# Patient Record
Sex: Female | Born: 1955
Health system: Southern US, Community
[De-identification: ages and names within clinical notes are randomized; demographics above are authoritative.]

## PROBLEM LIST (undated history)

## (undated) DIAGNOSIS — E039 Hypothyroidism, unspecified: Secondary | ICD-10-CM

## (undated) DIAGNOSIS — Z8249 Family history of ischemic heart disease and other diseases of the circulatory system: Secondary | ICD-10-CM

## (undated) DIAGNOSIS — R079 Chest pain, unspecified: Secondary | ICD-10-CM

## (undated) DIAGNOSIS — E785 Hyperlipidemia, unspecified: Secondary | ICD-10-CM

## (undated) HISTORY — DX: Hypothyroidism, unspecified: E03.9

## (undated) HISTORY — DX: Family history of ischemic heart disease and other diseases of the circulatory system: Z82.49

## (undated) HISTORY — DX: Chest pain, unspecified: R07.9

## (undated) HISTORY — DX: Hyperlipidemia, unspecified: E78.5

---

## 1999-11-28 ENCOUNTER — Other Ambulatory Visit: Admission: RE | Admit: 1999-11-28 | Discharge: 1999-11-28 | Payer: Self-pay | Admitting: Obstetrics and Gynecology

## 2000-11-28 ENCOUNTER — Other Ambulatory Visit: Admission: RE | Admit: 2000-11-28 | Discharge: 2000-11-28 | Payer: Self-pay | Admitting: Obstetrics and Gynecology

## 2002-04-08 ENCOUNTER — Other Ambulatory Visit: Admission: RE | Admit: 2002-04-08 | Discharge: 2002-04-08 | Payer: Self-pay | Admitting: Obstetrics and Gynecology

## 2003-04-12 ENCOUNTER — Other Ambulatory Visit: Admission: RE | Admit: 2003-04-12 | Discharge: 2003-04-12 | Payer: Self-pay | Admitting: Obstetrics and Gynecology

## 2004-05-08 ENCOUNTER — Other Ambulatory Visit: Admission: RE | Admit: 2004-05-08 | Discharge: 2004-05-08 | Payer: Self-pay | Admitting: Obstetrics and Gynecology

## 2005-08-03 ENCOUNTER — Other Ambulatory Visit: Admission: RE | Admit: 2005-08-03 | Discharge: 2005-08-03 | Payer: Self-pay | Admitting: Obstetrics and Gynecology

## 2006-08-11 ENCOUNTER — Inpatient Hospital Stay (HOSPITAL_COMMUNITY): Admission: EM | Admit: 2006-08-11 | Discharge: 2006-08-12 | Payer: Self-pay | Admitting: Emergency Medicine

## 2006-09-13 ENCOUNTER — Ambulatory Visit: Payer: Self-pay | Admitting: Vascular Surgery

## 2010-06-12 LAB — CBC WITH DIFFERENTIAL
Basophil %: 0.4
Lymphocytes Absolute: 2 /uL
MCH: 29.3
MCHC: 33.4
MCV: 87.8
Monocyte %: 6.1
Neutro Abs: 2.52
Platelet: 203
RDW-CV: 15.2
WBC: 4.77

## 2010-06-12 LAB — MICROALBUMIN / CREATININE URINE RATIO: Microalb, Ur: 1.9

## 2010-12-12 LAB — CBC WITH DIFFERENTIAL
BASO#: 0 /uL
Eosinophil #: 0.1
LYMPH#: 1.6
MPV: 9 fL (ref 7.5–11.5)
NEUT#: 2
Platelet: 215
RBC: 4.48
RDW: 15
WBC: 4

## 2010-12-12 LAB — COMPREHENSIVE METABOLIC PANEL
AST (SGOT): 22
Bilirubin Total: 0.3
Calcium: 9.2
Creat: 1
Glucose: 83
Sodium: 139
Total Protein: 7.9

## 2010-12-12 LAB — LIPID PANEL
Chol/HDL Ratio: 3.3
Cholesterol: 199
HDL: 60
VLDL Cholesterol Cal: 18

## 2011-01-03 LAB — T4 AND TSH: T4, Total: 12.8

## 2011-01-09 LAB — THYROTROPIN BINDING INHIB IG: Thyrotropin Binding Inhib Ig: 23.6

## 2012-01-16 LAB — CBC WITH DIFFERENTIAL
Basophils Absolute: 0
Eosinophil #: 0.1
HCT: 41.8
HGB: 13.6
LYMPH%: 37 %
MONO%: 7 %
MPV: 10.4 fL (ref 7.5–11.5)
NEUT#: 2.4
NEUT%: 54 %
RBC: 4.51
RDW: 15.6
WBC: 4.4

## 2012-01-16 LAB — COMPREHENSIVE METABOLIC PANEL
AST: 13
Albumin: 3.7
BUN: 22
Calcium: 8.9
Carbon Dioxide, Total: 26
Chloride: 104
Glucose: 83
Potassium: 4.1

## 2012-01-16 LAB — LIPID PANEL
Chol/HDL Ratio: 3
Cholesterol: 172 (ref 0–200)
HDL: 58
LDL (calc): 106
Triglycerides: 41

## 2012-01-16 LAB — TSH+FREE T4: Free T4: 0.8

## 2012-01-16 LAB — PROTIME-INR: Protime: 10.5

## 2012-05-16 ENCOUNTER — Encounter: Payer: Self-pay | Admitting: Gastroenterology

## 2012-06-03 LAB — TSH+FREE T4
Free T4: 1.08
TSH: 0.61

## 2012-06-03 LAB — CBC WITH DIFFERENTIAL
BASO%: 1 %
EOS%: 1 %
HCT: 41.9
LYMPH#: 1.5
MCH: 30.6
MCHC: 33.4
MCV: 91.5
MONO%: 7 %
NEUT#: 1.9
Platelets: 200
RBC: 4.58
RDW: 15.9
WBC: 3.7

## 2012-06-03 LAB — URINALYSIS
Bilirubin: NEGATIVE
Glucose: NORMAL
Ketones: NEGATIVE
Protein: NEGATIVE
Urobilinogen, UA: NORMAL

## 2012-06-03 LAB — COMPREHENSIVE METABOLIC PANEL
Albumin: 3.6
BUN/Creatinine Ratio: 22
Glucose: 77
Potassium: 3.9
Total Protein: 7.9

## 2012-06-03 LAB — LYME IGG/IGM AB: Lyme IgG/IgM Ab: NEGATIVE

## 2012-06-03 LAB — LIPID PANEL
HDL: 74
Triglycerides: 27

## 2012-06-19 ENCOUNTER — Encounter: Payer: Self-pay | Admitting: Gastroenterology

## 2012-06-19 ENCOUNTER — Ambulatory Visit (AMBULATORY_SURGERY_CENTER): Payer: BC Managed Care – PPO | Admitting: *Deleted

## 2012-06-19 VITALS — Ht 62.0 in | Wt 136.8 lb

## 2012-06-19 DIAGNOSIS — Z1211 Encounter for screening for malignant neoplasm of colon: Secondary | ICD-10-CM

## 2012-06-19 MED ORDER — MOVIPREP 100 G PO SOLR: ORAL | Status: DC

## 2012-06-30 ENCOUNTER — Ambulatory Visit (AMBULATORY_SURGERY_CENTER): Payer: BC Managed Care – PPO | Admitting: Gastroenterology

## 2012-06-30 ENCOUNTER — Encounter: Payer: Self-pay | Admitting: Gastroenterology

## 2012-06-30 VITALS — BP 95/55 | HR 69 | Temp 98.5°F | Resp 11 | Ht 62.0 in | Wt 136.0 lb

## 2012-06-30 DIAGNOSIS — D126 Benign neoplasm of colon, unspecified: Secondary | ICD-10-CM

## 2012-06-30 DIAGNOSIS — Z1211 Encounter for screening for malignant neoplasm of colon: Secondary | ICD-10-CM

## 2012-06-30 HISTORY — PX: COLONOSCOPY: SHX174

## 2012-06-30 MED ORDER — SODIUM CHLORIDE 0.9 % IV SOLN: 500.0000 mL | INTRAVENOUS | Status: DC

## 2012-06-30 NOTE — Op Note (Signed)
[facility] [address] [place], [address]   COLONOSCOPY PROCEDURE REPORT  PATIENT: Bonnie Larson, Bonnie Larson  MR#: [id number] BIRTHDATE: 12-24-1955 , 56  yrs. old GENDER: Female ENDOSCOPIST: Rachael Fee, MD REFERRED NW:[id number] Vincente Poli, M.D. PROCEDURE DATE:  06/30/2012 PROCEDURE:   Colonoscopy with snare polypectomy ASA CLASS:   Class III INDICATIONS:average risk screening. MEDICATIONS: Fentanyl 75 mcg IV, Versed 7 mg IV, and These medications were titrated to patient response per physician's verbal order  DESCRIPTION OF PROCEDURE:   After the risks benefits and alternatives of the procedure were thoroughly explained, informed consent was obtained.  A digital rectal exam revealed no abnormalities of the rectum.   The LB CF-[id number]  endoscope was introduced through the anus and advanced to the cecum, which was identified by both the appendix and ileocecal valve. No adverse events experienced.   The quality of the prep was good, using MoviPrep  The instrument was then slowly withdrawn as the colon was fully examined.  COLON FINDINGS: Two small polyps were found, removed and both sent to pathology.  These were both sessile, 2-42mm across, located in ascending and sigmoid segments, removed with cold snare (jar 1). The examination was otherwise normal.  Retroflexed views revealed no abnormalities. The time to cecum=2 minutes 57 seconds. Withdrawal time=9 minutes 09 seconds.  The scope was withdrawn and the procedure completed. COMPLICATIONS: There were no complications.  ENDOSCOPIC IMPRESSION: Two small polyps were found, removed and both were sent to pathology. The examination was otherwise normal.  RECOMMENDATIONS: If the polyp(s) removed today are proven to be adenomatous (pre-cancerous) polyps, you will need a repeat colonoscopy in 5 years.  Otherwise you should continue to follow colorectal cancer screening guidelines for "routine risk" patients  with colonoscopy in 10 years.  You will receive a letter within 1-2 weeks with the results of your biopsy as well as final recommendations.  Please call my office if you have not received a letter after 3 weeks.   eSigned:  Rachael Fee, MD 06/30/2012 1:42 PM

## 2012-06-30 NOTE — Progress Notes (Signed)
Patient did not experience any of the following events: a burn prior to discharge; a fall within the facility; wrong site/side/patient/procedure/implant event; or a hospital transfer or hospital admission upon discharge from the facility. (G8907) Patient did not have preoperative order for IV antibiotic SSI prophylaxis. (G8918)  

## 2012-06-30 NOTE — Patient Instructions (Signed)
YOU HAD AN ENDOSCOPIC PROCEDURE TODAY AT THE [facility]: Refer to the procedure report that was given to you for any specific questions about what was found during the examination.  If the procedure report does not answer your questions, please call your gastroenterologist to clarify.  If you requested that your care partner not be given the details of your procedure findings, then the procedure report has been included in a sealed envelope for you to review at your convenience later.  YOU SHOULD EXPECT: Some feelings of bloating in the abdomen. Passage of more gas than usual.  Walking can help get rid of the air that was put into your GI tract during the procedure and reduce the bloating. If you had a lower endoscopy (such as a colonoscopy or flexible sigmoidoscopy) you may notice spotting of blood in your stool or on the toilet paper. If you underwent a bowel prep for your procedure, then you may not have a normal bowel movement for a few days.  DIET: Your first meal following the procedure should be a light meal and then it is ok to progress to your normal diet.  A half-sandwich or bowl of soup is an example of a good first meal.  Heavy or fried foods are harder to digest and may make you feel nauseous or bloated.  Likewise meals heavy in dairy and vegetables can cause extra gas to form and this can also increase the bloating.  Drink plenty of fluids but you should avoid alcoholic beverages for 24 hours.  ACTIVITY: Your care partner should take you home directly after the procedure.  You should plan to take it easy, moving slowly for the rest of the day.  You can resume normal activity the day after the procedure however you should NOT DRIVE or use heavy machinery for 24 hours (because of the sedation medicines used during the test).    SYMPTOMS TO REPORT IMMEDIATELY: A gastroenterologist can be reached at any hour.  During normal business hours, 8:30 AM to 5:00 PM Monday through Friday,  call [phone].  After hours and on weekends, please call the GI answering service at [phone] who will take a message and have the physician on call contact you.   Following lower endoscopy (colonoscopy or flexible sigmoidoscopy):  Excessive amounts of blood in the stool  Significant tenderness or worsening of abdominal pains  Swelling of the abdomen that is new, acute  Fever of 100F or higher    FOLLOW UP: If any biopsies were taken you will be contacted by phone or by letter within the next 1-3 weeks.  Call your gastroenterologist if you have not heard about the biopsies in 3 weeks.  Our staff will call the home number listed on your records the next business day following your procedure to check on you and address any questions or concerns that you may have at that time regarding the information given to you following your procedure. This is a courtesy call and so if there is no answer at the home number and we have not heard from you through the emergency physician on call, we will assume that you have returned to your regular daily activities without incident.  SIGNATURES/CONFIDENTIALITY: You and/or your care partner have signed paperwork which will be entered into your electronic medical record.  These signatures attest to the fact that that the information above on your After Visit Summary has been reviewed and is understood.  Full responsibility of the confidentiality   of this discharge information lies with you and/or your care-partner.     

## 2012-07-01 ENCOUNTER — Telehealth: Payer: Self-pay | Admitting: *Deleted

## 2012-07-01 NOTE — Telephone Encounter (Signed)
  Follow up Call-  Call back number 06/30/2012  Post procedure Call Back phone  # [phone]  Permission to leave phone message Yes     [facility]

## 2012-07-10 ENCOUNTER — Encounter: Payer: Self-pay | Admitting: Gastroenterology

## 2012-12-04 ENCOUNTER — Other Ambulatory Visit: Payer: Self-pay | Admitting: Obstetrics and Gynecology

## 2013-11-21 LAB — COMPREHENSIVE METABOLIC PANEL
ALT: 21
ANION GAP: 12.5
AST (SGOT): 14
Albumin: 3.8
Alkaline Phosphatase: 38
BILIRUBIN TOTAL: 0.4
BUN / CREAT RATIO: 20.9
BUN: 23
CO2: 26
CREATININE: 1.1
Calcium: 9
Chloride: 104
Glucose: 85
Potassium: 3.9
SODIUM: 139
Total Protein: 8 g/dL

## 2013-11-21 LAB — URINALYSIS
BILIRUBIN (URINE): NEGATIVE
BLOOD UA: NEGATIVE
GLUCOSE: NORMAL
KETONES: NEGATIVE
Leukocytes, UA: NEGATIVE — AB
NITRITE: POSITIVE
PROTEIN UR: NEGATIVE
Specific Gravity, Urine: 1.025
URINE PH: 5
UROBILINOGEN UA: NORMAL

## 2013-11-21 LAB — CBC WITH DIFFERENTIAL
BASO%: 1 %
Basophils Absolute: 0
EOS ABS: 0.1
EOS%: 1 %
HCT: 42.8
HEMOGLOBIN: 14.2
LYMPH#: 1.7
LYMPH%: 42 %
MCH: 30.2
MCHC: 33.2
MCV: 91
MONO#: 0.3
MONO%: 7 %
MPV: 9.1 fL (ref 7.5–11.5)
NEUT#: 2
NEUTROS PCT: 48.8
PLATELETS: 197
RBC: 4.71
RDW: 15.1
WBC: 4.1

## 2013-11-21 LAB — TSH+FREE T4
Free T4: 1.03
TSH: 0.34

## 2013-11-21 LAB — LIPID PANEL
CHOLESTEROL: 198
Chol/HDL Ratio, serum: 2.5
HDL: 78
LDL (calc): 111
TRIGLYCERIDES: 46
VLDL CHOLESTEROL CAL: 9

## 2013-12-29 ENCOUNTER — Other Ambulatory Visit: Payer: Self-pay | Admitting: Family Medicine

## 2013-12-29 DIAGNOSIS — Z1231 Encounter for screening mammogram for malignant neoplasm of breast: Secondary | ICD-10-CM

## 2014-01-21 ENCOUNTER — Ambulatory Visit
Admission: RE | Admit: 2014-01-21 | Discharge: 2014-01-21 | Disposition: A | Discharge status: Home / Self Care | Payer: BC Managed Care – PPO | Source: Ambulatory Visit | Attending: Family Medicine | Admitting: Family Medicine

## 2014-01-21 DIAGNOSIS — Z1231 Encounter for screening mammogram for malignant neoplasm of breast: Secondary | ICD-10-CM

## 2014-09-22 ENCOUNTER — Other Ambulatory Visit: Payer: Self-pay

## 2014-09-22 LAB — TSH+FREE T4
T4,FREE (DIRECT): 1.35
TSH: 0.521

## 2014-09-22 LAB — CBC WITH DIFFERENTIAL
Basophils Absolute: 1
EOS: 1 %
HEMATOCRIT: 43
HEMOGLOBIN: 14.1
LYMPHS PCT: 40
Lymphocytes Absolute: 2 /uL
MCH: 29.4
MCHC: 33.2
MCV: 89
MONOS PCT: 7
Monocytes Absolute: 0
Neutro Abs: 2.5
Neutrophils: 51
Platelets: 207
RBC: 4.79
RDW: 15.2
WBC: 4.8

## 2014-09-22 LAB — LIPID PANEL
Cholesterol, Total: 221
HDL Cholesterol: 81
LDL CHOLESTEROL (CALC): 126
LDL/HDL RATIO: 1.6
TRIGLYCERIDES: 69
VLDL Cholesterol, Calc: 14

## 2014-09-22 LAB — COMPREHENSIVE METABOLIC PANEL
ALBUMIN SERUM: 4.4
ALK PHOS: 38
ALT: 16
AST: 20
Albumin/Globulin Ratio: 1.5
BILIRUBIN TOTAL: 0.3
BUN / CREAT RATIO: 19
BUN: 18
CHLORIDE, SERUM: 104
CREATININE: 0.93
Calcium, Ser: 9.1
Carbon Dioxide, Total: 22
EGFR (African American): 79
EGFR (Non-African Amer.): 69
GLOBULIN, TOTAL: 2.9
Glucose: 72
Potassium, serum: 4.3
Protein S, Total: 7.3
SODIUM, SERUM: 142

## 2014-09-22 LAB — URINALYSIS
Specific Gravity, Urine: 1.023
Urine pH: 5.5

## 2014-10-14 ENCOUNTER — Ambulatory Visit: Payer: BLUE CROSS/BLUE SHIELD

## 2015-02-08 ENCOUNTER — Other Ambulatory Visit: Payer: Self-pay | Admitting: Family Medicine

## 2015-02-08 DIAGNOSIS — Z1231 Encounter for screening mammogram for malignant neoplasm of breast: Secondary | ICD-10-CM

## 2015-02-11 ENCOUNTER — Ambulatory Visit
Admission: RE | Admit: 2015-02-11 | Discharge: 2015-02-11 | Disposition: A | Discharge status: Home / Self Care | Payer: BLUE CROSS/BLUE SHIELD | Source: Ambulatory Visit | Attending: Family Medicine | Admitting: Family Medicine

## 2015-02-11 DIAGNOSIS — Z1231 Encounter for screening mammogram for malignant neoplasm of breast: Secondary | ICD-10-CM

## 2015-08-02 ENCOUNTER — Telehealth: Payer: Self-pay | Admitting: Cardiovascular Disease

## 2015-08-02 NOTE — Telephone Encounter (Signed)
Received records from [facility] for appointment on 08/25/15 with Dr Oval Linsey.  Records given to [facility] (medical records) for Dr Blenda Mounts schedule on 08/25/15. lp

## 2015-08-09 ENCOUNTER — Encounter: Payer: Self-pay | Admitting: Cardiovascular Disease

## 2015-08-25 ENCOUNTER — Ambulatory Visit: Payer: BLUE CROSS/BLUE SHIELD | Admitting: Cardiovascular Disease

## 2015-09-04 NOTE — Progress Notes (Signed)
Cardiology Office Note   Date:  09/05/2015   ID:  Bonnie Larson, DOB 1955/10/06, MRN [id number]  PCP:  Tula Nakayama  Cardiologist:   Sharol Harness, MD   Chief Complaint  Patient presents with  . New Evaluation    to get established due to family history      History of Present Illness: Bonnie Larson is a 60 y.o. female who presents for an evaluation of jaw pain.  Bonnie Larson saw he PCP, Bing Matter, PA-C on 1/17.  At that appointment she reported ja pain and was referred to cardiology for further evaluation.  EKG at that time showed normal sinus rhythm and no evidence of ischemia.  Bonnie Larson had a viral infection in December and later developed pain in her jaw that occurred every night.  She had pain with talking or moving her jaws.  She was found to have a sinus infection and bilateral ear infections.  Her PCP referred her to be established with cardiology.  She has a history of hyperlipidemia and does not want to take any cholesterol medication.    Whenever she has a cold she has chest pressure and feels like someone is sitting on her chest.  These symptoms resolve when her cold goes away.  She does not exercise regularly anymore.  In December she was exercising 5 days per week.  She likes to take aerobics and Zumba classes.  Last year she occasionally had chest pressure with exercise, but not recently.  However, she no longer exercises intensely because she injures herself easily.  She denies any shortness of breath out of proportion with what is expected with exercise.  She also denies lower extremity edema, orthopnea or PND.  [name] notes that her cholesterol levels are elevated. She thinks that her total cholesterol was around 260 but that her HDL was good.  Her doctor suggested taking a statin, but she was reluctant to do so because her father developed myalgias.  She has been taking red yeast rice intermittently.  She has not been exercising or  made any changes to her diet.   Past Medical History  Diagnosis Date  . Hypothyroidism   . Chest pain 09/05/2015  . Hyperlipidemia 09/05/2015  . Family history of premature CAD 09/05/2015    Past Surgical History  Procedure Laterality Date  . Cesarean section  1981, 1983, 1986    x3     Current Outpatient Prescriptions  Medication Sig Dispense Refill  . Multiple Vitamin (MULTIVITAMIN) tablet Take 1 tablet by mouth daily.    . NON FORMULARY Progesterone compound takes as directed.    . NON FORMULARY Estrogen cream daily, compounded at [facility]    . NON FORMULARY Testosterone cream daily ,compounded @@ [facility]    . thyroid (ARMOUR THYROID) 30 MG tablet Take 30 mg by mouth daily.     No current facility-administered medications for this visit.    Allergies:   Decadron; Codeine; Propranolol; Topamax; and Triptans    Social History:  The patient  reports that she quit smoking about 35 years ago. She has never used smokeless tobacco. She reports that she drinks about 1.8 oz of alcohol per week. She reports that she does not use illicit drugs.   Family History:  The patient's family history includes Aneurysm in her maternal grandmother; Angina in her maternal grandmother; Stroke in her maternal grandmother and paternal grandfather. There is no history of Colon cancer or Stomach  cancer.    ROS:  Please see the history of present illness.   Otherwise, review of systems are positive for none.   All other systems are reviewed and negative.    PHYSICAL EXAM: VS:  BP 94/74 mmHg  Pulse 76  Ht 5' 2.75" (1.594 m)  Wt 62.596 kg (138 lb)  BMI 24.64 kg/m2 , BMI Body mass index is 24.64 kg/(m^2). GENERAL:  Well appearing HEENT:  Pupils equal round and reactive, fundi not visualized, oral mucosa unremarkable NECK:  No jugular venous distention, waveform within normal limits, carotid upstroke brisk and symmetric, no bruits, no thyromegaly LYMPHATICS:  No cervical  adenopathy LUNGS:  Clear to auscultation bilaterally HEART:  RRR.  PMI not displaced or sustained,S1 and S2 within normal limits, no S3, no S4, no clicks, no rubs, no murmurs ABD:  Flat, positive bowel sounds normal in frequency in pitch, no bruits, no rebound, no guarding, no midline pulsatile mass, no hepatomegaly, no splenomegaly EXT:  2 plus pulses throughout, no edema, no cyanosis no clubbing SKIN:  No rashes no nodules NEURO:  Cranial nerves II through XII grossly intact, motor grossly intact throughout PSYCH:  Cognitively intact, oriented to person place and time   EKG:  EKG is not ordered today. The ekg ordered 08/02/15 demonstrates sinus rhythm rate 65 bpm.   Recent Labs: No results found for requested labs within last 365 days.    Lipid Panel No results found for: CHOL, TRIG, HDL, CHOLHDL, VLDL, LDLCALC, LDLDIRECT    Wt Readings from Last 3 Encounters:  09/05/15 62.596 kg (138 lb)  10/14/14 60.419 kg (133 lb 3.2 oz)  06/30/12 61.689 kg (136 lb)      ASSESSMENT AND PLAN:  # Atypcial chest pain:  [name] had chest pain with exercise last year that has not recurred.  However, she has not been exercising to know if this persists.  Given this and her family history, we will obtain a treadmill stress test to evaluate for ischemia.  # Hyperlipidemia:  [name] notes that her cholesterol levels have been elevated.  She is reluctant to take cholesterol medicine, but has intermittently taken red yeast rice.  She will work on diet and increasing her exercise to at least 30-40 minutes most days of the week.  We will repeat her lipids and CMP in 3 months.  We will determine her need for aspirin based on those levels.   Current medicines are reviewed at length with the patient today.  The patient does not have concerns regarding medicines.  The following changes have been made:  no change  Labs/ tests ordered today include:   Orders Placed This Encounter  Procedures  .  Lipid panel  . Comprehensive metabolic panel  . Exercise Tolerance Test     Disposition:   FU with Wakisha Alberts C. Oval Linsey, MD, [facility] in 1 year.    This note was written with the assistance of speech recognition software.  Please excuse any transcriptional errors.  Signed, Dorrie Cocuzza C. Oval Linsey, MD, [facility]  09/05/2015 1:18 PM    [place]

## 2015-09-05 ENCOUNTER — Encounter: Payer: Self-pay | Admitting: Cardiovascular Disease

## 2015-09-05 ENCOUNTER — Ambulatory Visit (INDEPENDENT_AMBULATORY_CARE_PROVIDER_SITE_OTHER): Payer: BLUE CROSS/BLUE SHIELD | Admitting: Cardiovascular Disease

## 2015-09-05 VITALS — BP 94/74 | HR 76 | Ht 62.75 in | Wt 138.0 lb

## 2015-09-05 DIAGNOSIS — R079 Chest pain, unspecified: Secondary | ICD-10-CM

## 2015-09-05 DIAGNOSIS — R072 Precordial pain: Secondary | ICD-10-CM

## 2015-09-05 DIAGNOSIS — E785 Hyperlipidemia, unspecified: Secondary | ICD-10-CM | POA: Diagnosis not present

## 2015-09-05 DIAGNOSIS — Z8249 Family history of ischemic heart disease and other diseases of the circulatory system: Secondary | ICD-10-CM

## 2015-09-05 HISTORY — DX: Hyperlipidemia, unspecified: E78.5

## 2015-09-05 HISTORY — DX: Chest pain, unspecified: R07.9

## 2015-09-05 HISTORY — DX: Family history of ischemic heart disease and other diseases of the circulatory system: Z82.49

## 2015-09-05 NOTE — Patient Instructions (Addendum)
LABS IN 3 MONTHS- CMP LIPIDS  WILL  MAIL YOU A LAB SLIP AT THAT TIME    Your physician has requested that you have an exercise tolerance test. For further information please visit [email or web address]. Please also follow instruction sheet, as given.       High Cholesterol High cholesterol refers to having a high level of cholesterol in your blood. Cholesterol is a white, waxy, fat-like protein that your body needs in small amounts. Your liver makes all the cholesterol you need. Excess cholesterol comes from the food you eat. Cholesterol travels in your bloodstream through your blood vessels. If you have high cholesterol, deposits (plaque) may build up on the walls of your blood vessels. This makes the arteries narrower and stiffer. Plaque increases your risk of heart attack and stroke. Work with your health care provider to keep your cholesterol levels in a healthy range. RISK FACTORS Several things can make you more likely to have high cholesterol. These include:   Eating foods high in animal fat (saturated fat) or cholesterol.  Being overweight.  Not getting enough exercise.  Having a family history of high cholesterol. SIGNS AND SYMPTOMS High cholesterol does not cause symptoms. DIAGNOSIS  Your health care provider can do a blood test to check whether you have high cholesterol. If you are older than 20, your health care provider may check your cholesterol every 4-6 years. You may be checked more often if you already have high cholesterol or other risk factors for heart disease. The blood test for cholesterol measures the following:  Bad cholesterol (LDL cholesterol). This is the type of cholesterol that causes heart disease. This number should be less than 100.  Good cholesterol (HDL cholesterol). This type helps protect against heart disease. A healthy level of HDL cholesterol is 60 or higher.  Total cholesterol. This is the combined number of LDL cholesterol and HDL cholesterol.  A healthy number is less than 200. TREATMENT  High cholesterol can be treated with diet changes, lifestyle changes, and medicine.   Diet changes may include eating more whole grains, fruits, vegetables, nuts, and fish. You may also have to cut back on red meat and foods with a lot of added sugar.  Lifestyle changes may include getting at least 40 minutes of aerobic exercise three times a week. Aerobic exercises include walking, biking, and swimming. Aerobic exercise along with a healthy diet can help you maintain a healthy weight. Lifestyle changes may also include quitting smoking.  If diet and lifestyle changes are not enough to lower your cholesterol, your health care provider may prescribe a statin medicine. This medicine has been shown to lower cholesterol and also lower the risk of heart disease. HOME CARE INSTRUCTIONS  Only take over-the-counter or prescription medicines as directed by your health care provider.   Follow a healthy diet as directed by your health care provider. For instance:   Eat chicken (without skin), fish, veal, shellfish, ground [place] breast, and round or loin cuts of red meat.  Do not eat fried foods and fatty meats, such as hot dogs and salami.   Eat plenty of fruits, such as apples.   Eat plenty of vegetables, such as broccoli, potatoes, and carrots.   Eat beans, peas, and lentils.   Eat grains, such as barley, rice, couscous, and bulgur wheat.   Eat pasta without cream sauces.   Use skim or nonfat milk and low-fat or nonfat yogurt and cheeses. Do not eat or drink whole milk, cream, ice cream,  egg yolks, and hard cheeses.   Do not eat stick margarine or tub margarines that contain trans fats (also called partially hydrogenated oils).   Do not eat cakes, cookies, crackers, or other baked goods that contain trans fats.   Do not eat saturated tropical oils, such as coconut and palm oil.   Exercise as directed by your health care provider.  Increase your activity level with activities such as gardening or walking.   Keep all follow-up appointments.  SEEK MEDICAL CARE IF:  You are struggling to maintain a healthy diet or weight.  You need help starting an exercise program.  You need help to stop smoking. SEEK IMMEDIATE MEDICAL CARE IF:  You have chest pain.  You have trouble breathing.   This information is not intended to replace advice given to you by your health care provider. Make sure you discuss any questions you have with your health care provider.   Document Released: 07/02/2005 Document Revised: 07/23/2014 Document Reviewed: 04/24/2013 Elsevier Interactive Patient Education 2016 [place] physician wants you to follow-up in [place]  You will receive a reminder letter in the mail two months in advance. If you don't receive a letter, please call our office to schedule the follow-up appointment.

## 2015-09-22 ENCOUNTER — Telehealth (HOSPITAL_COMMUNITY): Payer: Self-pay

## 2015-09-22 NOTE — Telephone Encounter (Signed)
Encounter complete. 

## 2015-09-27 ENCOUNTER — Ambulatory Visit (HOSPITAL_COMMUNITY)
Admission: RE | Admit: 2015-09-27 | Discharge: 2015-09-27 | Disposition: A | Discharge status: Home / Self Care | Payer: BLUE CROSS/BLUE SHIELD | Source: Ambulatory Visit | Attending: Cardiovascular Disease | Admitting: Cardiovascular Disease

## 2015-09-27 DIAGNOSIS — R072 Precordial pain: Secondary | ICD-10-CM | POA: Diagnosis not present

## 2015-09-27 DIAGNOSIS — E785 Hyperlipidemia, unspecified: Secondary | ICD-10-CM | POA: Insufficient documentation

## 2015-09-27 LAB — EXERCISE TOLERANCE TEST
CHL CUP MPHR: 163 {beats}/min
CHL CUP RESTING HR STRESS: 73 {beats}/min
CHL RATE OF PERCEIVED EXERTION: 17
CSEPHR: 109 %
Estimated workload: 12.3 METS
Exercise duration (min): 10 min
Exercise duration (sec): 23 s
Peak HR: 179 {beats}/min

## 2016-05-29 ENCOUNTER — Other Ambulatory Visit: Payer: Self-pay

## 2016-05-29 LAB — CBC
Basophils Absolute: 0
EOS (ABSOLUTE): 0
HCT: 44.5
HGB: 14.8
LYMPHS ABS: 1.3
Lymphocytes, automated: 35.6
MCH: 30.2
MCHC: 33.3
MCV: 90.8
MONO ABS: 0
MPV: 11.2 fL (ref 7.5–11.5)
NEUTRO ABS: 2 /uL
PLATELETS: 163
RBC: 4.9
RDW: 14.9
WBC: 3.6

## 2016-05-29 LAB — URINALYSIS: Urinalysis: NEGATIVE

## 2016-05-29 LAB — COMPREHENSIVE METABOLIC PANEL
ALK PHOS: 45
ALT: 16
AST: 10
Albumin: 3.9
Anion gap: 12.6
BILIRUBIN TOTAL: 0.5
BUN: 24
CHLORIDE: 103
CO2: 28
CREATININE: 0.93
Calcium: 9.1
GFR CALC AF AMER: 75
GFR CALC NON AF AMER: 62
GLUCOSE: 80
Potassium: 3.8
Sodium: 140
Total Protein: 7.6

## 2016-05-29 LAB — LIPID PANEL
CHOLESTEROL: 227
LDL DIRECT: 135
Non HDL Cholesterol: 160
Triglycerides: 51

## 2016-05-29 LAB — TSH+FREE T4
Free T4: 1.3
TSH: 0.47

## 2017-02-12 ENCOUNTER — Ambulatory Visit: Payer: Self-pay

## 2017-03-06 ENCOUNTER — Other Ambulatory Visit: Payer: Self-pay

## 2017-03-07 ENCOUNTER — Other Ambulatory Visit: Payer: Self-pay

## 2017-04-03 ENCOUNTER — Other Ambulatory Visit: Payer: Self-pay | Admitting: Family Medicine

## 2017-04-03 DIAGNOSIS — Z1231 Encounter for screening mammogram for malignant neoplasm of breast: Secondary | ICD-10-CM

## 2017-04-10 ENCOUNTER — Ambulatory Visit
Admission: RE | Admit: 2017-04-10 | Discharge: 2017-04-10 | Disposition: A | Discharge status: Home / Self Care | Payer: BLUE CROSS/BLUE SHIELD | Source: Ambulatory Visit | Attending: Family Medicine | Admitting: Family Medicine

## 2017-04-10 DIAGNOSIS — Z1231 Encounter for screening mammogram for malignant neoplasm of breast: Secondary | ICD-10-CM

## 2017-07-17 ENCOUNTER — Other Ambulatory Visit: Payer: Self-pay

## 2017-07-19 ENCOUNTER — Encounter: Payer: Self-pay | Admitting: Gastroenterology

## 2017-08-15 ENCOUNTER — Encounter: Payer: Self-pay | Admitting: Gastroenterology

## 2017-10-02 ENCOUNTER — Encounter: Payer: Self-pay | Admitting: Gastroenterology

## 2017-10-02 ENCOUNTER — Ambulatory Visit (AMBULATORY_SURGERY_CENTER): Payer: Self-pay | Admitting: *Deleted

## 2017-10-02 ENCOUNTER — Other Ambulatory Visit: Payer: Self-pay

## 2017-10-02 VITALS — Ht 62.5 in | Wt 143.0 lb

## 2017-10-02 DIAGNOSIS — Z01419 Encounter for gynecological examination (general) (routine) without abnormal findings: Secondary | ICD-10-CM | POA: Insufficient documentation

## 2017-10-02 DIAGNOSIS — H65499 Other chronic nonsuppurative otitis media, unspecified ear: Secondary | ICD-10-CM | POA: Insufficient documentation

## 2017-10-02 DIAGNOSIS — R6884 Jaw pain: Secondary | ICD-10-CM | POA: Insufficient documentation

## 2017-10-02 DIAGNOSIS — J019 Acute sinusitis, unspecified: Secondary | ICD-10-CM | POA: Insufficient documentation

## 2017-10-02 DIAGNOSIS — R200 Anesthesia of skin: Secondary | ICD-10-CM | POA: Insufficient documentation

## 2017-10-02 DIAGNOSIS — Z86010 Personal history of colonic polyps: Secondary | ICD-10-CM

## 2017-10-02 DIAGNOSIS — E663 Overweight: Secondary | ICD-10-CM | POA: Insufficient documentation

## 2017-10-02 DIAGNOSIS — M79673 Pain in unspecified foot: Secondary | ICD-10-CM | POA: Insufficient documentation

## 2017-10-02 DIAGNOSIS — E039 Hypothyroidism, unspecified: Secondary | ICD-10-CM | POA: Insufficient documentation

## 2017-10-02 MED ORDER — NA SULFATE-K SULFATE-MG SULF 17.5-3.13-1.6 GM/177ML PO SOLN: ORAL | 0 refills | Status: DC

## 2017-10-02 NOTE — Progress Notes (Signed)
Patient denies any allergies to eggs or soy. Patient denies any problems with anesthesia/sedation. Patient denies any oxygen use at home. Patient denies taking any diet/weight loss medications or blood thinners. EMMI education assisgned to patient on colonoscopy, this was explained and instructions given to patient. Patient does not want VERSED!! Explained that we given deep sedation propofol and she was relieved,happy about that. Patient requested lower volume prep, Suprep given. Pt understands the cost, $50 coupon given to pt during pv.

## 2017-10-16 ENCOUNTER — Other Ambulatory Visit: Payer: Self-pay

## 2017-10-16 ENCOUNTER — Ambulatory Visit (AMBULATORY_SURGERY_CENTER): Payer: BLUE CROSS/BLUE SHIELD | Admitting: Gastroenterology

## 2017-10-16 ENCOUNTER — Encounter: Payer: Self-pay | Admitting: Gastroenterology

## 2017-10-16 VITALS — BP 107/68 | HR 71 | Temp 98.4°F | Resp 9 | Ht 62.0 in | Wt 138.0 lb

## 2017-10-16 DIAGNOSIS — Z86010 Personal history of colonic polyps: Secondary | ICD-10-CM | POA: Diagnosis not present

## 2017-10-16 MED ORDER — SODIUM CHLORIDE 0.9 % IV SOLN: 500.0000 mL | Freq: Once | INTRAVENOUS | Status: AC

## 2017-10-16 NOTE — Op Note (Signed)
Center Patient Name: Bonnie Larson Procedure Date: 10/16/2017 8:23 AM MRN: [id number] Endoscopist: Milus Banister , MD Age: 62 Referring MD:  Date of Birth: 05-09-1956 Gender: Female Account #: [id number] Procedure:                Colonoscopy Indications:              High risk colon cancer surveillance: Personal                            history of colonic polyps; Colonoscopy 06/2012                            single subCM adenoma Medicines:                Monitored Anesthesia Care Procedure:                Pre-Anesthesia Assessment:                           - Prior to the procedure, a History and Physical                            was performed, and patient medications and                            allergies were reviewed. The patient's tolerance of                            previous anesthesia was also reviewed. The risks                            and benefits of the procedure and the sedation                            options and risks were discussed with the patient.                            All questions were answered, and informed consent                            was obtained. Prior Anticoagulants: The patient has                            taken no previous anticoagulant or antiplatelet                            agents. ASA Grade Assessment: II - A patient with                            mild systemic disease. After reviewing the risks                            and benefits, the patient was deemed in  satisfactory condition to undergo the procedure.                           After obtaining informed consent, the colonoscope                            was passed under direct vision. Throughout the                            procedure, the patient's blood pressure, pulse, and                            oxygen saturations were monitored continuously. The                            Colonoscope was introduced through the anus  and                            advanced to the the cecum, identified by                            appendiceal orifice and ileocecal valve. The                            colonoscopy was performed without difficulty. The                            patient tolerated the procedure well. The quality                            of the bowel preparation was excellent. The                            ileocecal valve, appendiceal orifice, and rectum                            were photographed. Scope In: 8:39:13 AM Scope Out: 8:47:22 AM Scope Withdrawal Time: 0 hours 6 minutes 18 seconds  Total Procedure Duration: 0 hours 8 minutes 9 seconds  Findings:                 The entire examined colon appeared normal on direct                            and retroflexion views. Complications:            No immediate complications. Estimated blood loss:                            None. Estimated Blood Loss:     Estimated blood loss: none. Impression:               - The entire examined colon is normal on direct and                            retroflexion views.                           -  No polyps or cancers. Recommendation:           - Patient has a contact number available for                            emergencies. The signs and symptoms of potential                            delayed complications were discussed with the                            patient. Return to normal activities tomorrow.                            Written discharge instructions were provided to the                            patient.                           - Resume previous diet.                           - Continue present medications.                           - Repeat colonoscopy in 10 years for screening. Milus Banister, MD 10/16/2017 8:49:50 AM This report has been signed electronically.

## 2017-10-16 NOTE — Progress Notes (Signed)
Report given to PACU, vss 

## 2017-10-16 NOTE — Patient Instructions (Signed)
YOU HAD AN ENDOSCOPIC PROCEDURE TODAY AT THE [facility]:   Refer to the procedure report that was given to you for any specific questions about what was found during the examination.  If the procedure report does not answer your questions, please call your gastroenterologist to clarify.  If you requested that your care partner not be given the details of your procedure findings, then the procedure report has been included in a sealed envelope for you to review at your convenience later.  YOU SHOULD EXPECT: Some feelings of bloating in the abdomen. Passage of more gas than usual.  Walking can help get rid of the air that was put into your GI tract during the procedure and reduce the bloating. If you had a lower endoscopy (such as a colonoscopy or flexible sigmoidoscopy) you may notice spotting of blood in your stool or on the toilet paper. If you underwent a bowel prep for your procedure, you may not have a normal bowel movement for a few days.  Please Note:  You might notice some irritation and congestion in your nose or some drainage.  This is from the oxygen used during your procedure.  There is no need for concern and it should clear up in a day or so.  SYMPTOMS TO REPORT IMMEDIATELY:   Following lower endoscopy (colonoscopy or flexible sigmoidoscopy):  Excessive amounts of blood in the stool  Significant tenderness or worsening of abdominal pains  Swelling of the abdomen that is new, acute  Fever of 100F or higher  For urgent or emergent issues, a gastroenterologist can be reached at any hour by calling [phone].   DIET:  We do recommend a small meal at first, but then you may proceed to your regular diet.  Drink plenty of fluids but you should avoid alcoholic beverages for 24 hours.  ACTIVITY:  You should plan to take it easy for the rest of today and you should NOT DRIVE or use heavy machinery until tomorrow (because of the sedation medicines used during the test).     FOLLOW UP: Our staff will call the number listed on your records the next business day following your procedure to check on you and address any questions or concerns that you may have regarding the information given to you following your procedure. If we do not reach you, we will leave a message.  However, if you are feeling well and you are not experiencing any problems, there is no need to return our call.  We will assume that you have returned to your regular daily activities without incident.  If any biopsies were taken you will be contacted by phone or by letter within the next 1-3 weeks.  Please call us at [phone] if you have not heard about the biopsies in 3 weeks.    SIGNATURES/CONFIDENTIALITY: You and/or your care partner have signed paperwork which will be entered into your electronic medical record.  These signatures attest to the fact that that the information above on your After Visit Summary has been reviewed and is understood.  Full responsibility of the confidentiality of this discharge information lies with you and/or your care-partner. 

## 2017-10-17 ENCOUNTER — Telehealth: Payer: Self-pay

## 2017-10-17 NOTE — Telephone Encounter (Signed)
Left message on answering machine. 

## 2018-05-15 ENCOUNTER — Other Ambulatory Visit: Payer: Self-pay | Admitting: Family Medicine

## 2018-05-15 DIAGNOSIS — Z1231 Encounter for screening mammogram for malignant neoplasm of breast: Secondary | ICD-10-CM

## 2018-06-30 ENCOUNTER — Ambulatory Visit
Admission: RE | Admit: 2018-06-30 | Discharge: 2018-06-30 | Disposition: A | Discharge status: Home / Self Care | Payer: BLUE CROSS/BLUE SHIELD | Source: Ambulatory Visit | Attending: Family Medicine | Admitting: Family Medicine

## 2018-06-30 DIAGNOSIS — Z1231 Encounter for screening mammogram for malignant neoplasm of breast: Secondary | ICD-10-CM

## 2018-08-13 ENCOUNTER — Encounter: Payer: Self-pay | Admitting: *Deleted

## 2018-08-13 ENCOUNTER — Ambulatory Visit: Payer: Self-pay | Attending: Oncology | Admitting: *Deleted

## 2018-08-13 ENCOUNTER — Encounter (INDEPENDENT_AMBULATORY_CARE_PROVIDER_SITE_OTHER): Payer: Self-pay

## 2018-08-13 ENCOUNTER — Ambulatory Visit
Admission: RE | Admit: 2018-08-13 | Discharge: 2018-08-13 | Disposition: A | Discharge status: Home / Self Care | Payer: Self-pay | Source: Ambulatory Visit | Attending: Oncology | Admitting: Oncology

## 2018-08-13 VITALS — BP 117/70 | HR 102 | Temp 98.4°F | Ht 63.0 in | Wt 203.4 lb

## 2018-08-13 DIAGNOSIS — Z0000 Encounter for general adult medical examination without abnormal findings: Secondary | ICD-10-CM | POA: Insufficient documentation

## 2018-08-13 NOTE — Progress Notes (Signed)
  Subjective:     Patient ID: Haley Anderson, female   DOB: Mar 16, 1956, 63 y.o.   MRN: [id number]  HPI   Review of Systems     Objective:   Physical Exam Chest:     Breasts:        Right: No swelling, bleeding, inverted nipple, mass, nipple discharge, skin change or tenderness.        Left: No swelling, bleeding, inverted nipple, mass, nipple discharge, skin change or tenderness.  Abdominal:     Palpations: There is no hepatomegaly or splenomegaly.  Genitourinary:    Exam position: Lithotomy position.     Labia:        Right: No rash, tenderness, lesion or injury.        Left: No rash, tenderness, lesion or injury.      Urethra: No prolapse, urethral pain, urethral swelling or urethral lesion.     Vagina: No vaginal discharge, erythema, tenderness, bleeding, lesions or prolapsed vaginal walls.     Adnexa:        Right: No mass, tenderness or fullness.         Left: No mass, tenderness or fullness.       Comments: Unable to visualize or palpate a cervix -  Lymphadenopathy:     Upper Body:     Right upper body: No supraclavicular or axillary adenopathy.     Left upper body: No supraclavicular or axillary adenopathy.        Assessment:     63 year old Black female presents to [facility] for clinical breast exam, and mammogram.  Clinical breast exam unremarkable.  Taught self breast awareness.  Patient states she has had a "partial hysterectomy" with only her ovaries removed.  On clinical inspection of the vagina I am unable to visualize or palpate the cervix.  I used a large speculum and can see and feel the vaginal vault.  I did collect a specimen for pap hoping to find an os.  Informed patient of my findings.  States "that surgeon was known to do hysterectomies.  I was having heavy bleeding".  Informed patient per my inspection it looks like she did have a hysterectomy.  We will see if any cervical cells show up on the pap.  Patient has been screened for eligibility.  She does not have  any insurance, Medicare or Medicaid.  She also meets financial eligibility.  Hand-out given on the Affordable Care Act.  Risk Assessment    Risk Scores      08/13/2018   Last edited by: Alta Corning, CMA   5-year risk: 1.4 %   Lifetime risk: 6.9 %            Plan:     Screening mammogram ordered.  Specimen for pap sent to the lab.  Will follow-up per [facility] protocol.

## 2018-08-13 NOTE — Patient Instructions (Signed)
HPV Test Why am I having this test? HPV (human papillomavirus) refers to a group of about 100 viruses. Many of these viruses cause growths on, in, or around the genitals. Most HPV viruses cause infections that usually go away without treatment.  The HPV test checks for high-risk types (strains) of HPV. Strains 16 and 18 are considered the most high-risk for cancer. If you have strain 16 or 18 HPV and it is not treated, it can increase your risk for cancer of the cervix, vagina, vulva, or anus. HPV can be found in both males and females. However, the HPV test is used to screen for increased cancer risk in females:  Who are 30-65 years old.  Who have an abnormal Pap test.  Who have been treated for an abnormal Pap test in the past.  Who have been treated for a high-risk HPV infection in the past. If you are a woman older than 30, you may have the HPV test at the same time as a pelvic exam and Pap test. What is being tested? This test checks for the DNA (genetic) strands of the HPV infection. This test is also called the HPV DNA test. What kind of sample is taken?  This test requires a sample of cells from the cervix. This will be done using a small cotton swab, plastic spatula, or brush. This sample is often collected during a pelvic exam, when you are lying on your back on an exam table with feet in footrests (stirrups). How do I prepare for this test?  Starting 24-48 hours before your test, or as told by your health care provider, do not: ? Take a bath. ? Have sex. ? Douche.  Schedule the test for a day when you are not menstruating. If you are menstruating on the day of the test, you may need to reschedule.  You will be asked to urinate right before the test. How are the results reported? Your test results will be reported as either positive or negative for HPV. If you have a positive result, the results will indicate which HPV strain you are positive for. What do the results mean? A  negative HPV test result means that no HPV was found. This means it is very likely that you do not have HPV. A positive HPV test result means that you have HPV.  If your results show the presence of any high-risk strains, you may have a higher risk of developing anal or cervical cancer if your infection is not treated.  If any low-risk HPV strains are found, it is not likely that you have an increased risk for cancer. Talk with your health care provider about what your results mean. Questions to ask your health care provider Ask your health care provider, or the department that is doing the test:  When will my results be ready?  How will I get my results?  What are my treatment options?  What other tests do I need?  What are my next steps? Summary  The human papillomavirus (HPV) test is used to look for high-risk types of HPV infection. This test is done only for females.  HPV types 16 and 18 are considered high-risk types of HPV. If untreated, these types of infections increase your risk for cancer of the cervix or anus.  A negative HPV test result means that no HPV was found, and it is very likely that you do not have HPV.  A positive HPV test result means that you   have an HPV infection. This information is not intended to replace advice given to you by your health care provider. Make sure you discuss any questions you have with your health care provider. Document Released: 07/27/2004 Document Revised: 05/14/2017 Document Reviewed: 05/14/2017 Elsevier Interactive Patient Education  2019 [employer]  Gave patient hand-out, Women Staying Healthy, Active and Well from [facility], with education on breast health, pap smears, heart and colon health.  

## 2018-08-17 LAB — PAP LB AND HPV HIGH-RISK: HPV, HIGH-RISK: NEGATIVE

## 2018-08-28 ENCOUNTER — Encounter: Payer: Self-pay | Admitting: *Deleted

## 2018-08-28 NOTE — Progress Notes (Signed)
Letter mailed to inform patient of her normal mammogram and pap smear.  Next mammogram in one year.  [facility] to [place].

## 2018-08-28 NOTE — Progress Notes (Signed)
Letter mailed to inform patient of her normal mammogram and pap results.  To follow up in one year with next mammogram.

## 2019-06-24 ENCOUNTER — Other Ambulatory Visit: Payer: Self-pay

## 2019-06-24 DIAGNOSIS — Z20822 Contact with and (suspected) exposure to covid-19: Secondary | ICD-10-CM

## 2019-06-25 LAB — NOVEL CORONAVIRUS, NAA: SARS-CoV-2, NAA: DETECTED — AB

## 2019-06-29 ENCOUNTER — Telehealth: Payer: Self-pay | Admitting: *Deleted

## 2019-06-29 ENCOUNTER — Ambulatory Visit: Payer: Self-pay | Admitting: *Deleted

## 2019-06-29 NOTE — Telephone Encounter (Signed)
  She knows she is COVID-19 positive from her MyChart message.    She called in wanting to know what she should do if she is getting winded easily and having chest pain.  I let her know she needed to call her PCP (she said she has one) or go to the ED or an urgent care ASAP.    She has agreed to call her PCP right now.   Reason for Disposition . [1] MILD difficulty breathing (e.g., minimal/no SOB at rest, SOB with walking, pulse <100) AND [2] NEW-onset or WORSE than normal    COVID-19 positive  Answer Assessment - Initial Assessment Questions 1. RESPIRATORY STATUS: "Describe your breathing?" (e.g., wheezing, shortness of breath, unable to speak, severe coughing)      I get short of breath of winded just unloading the dishwasher or walking around my house.   I'm hurting in my chest and back.   I am positive for the COVID-19 virus. 2. ONSET: "When did this breathing problem begin?"      2 days 3. PATTERN "Does the difficult breathing come and go, or has it been constant since it started?"      Winded with exertion 4. SEVERITY: "How bad is your breathing?" (e.g., mild, moderate, severe)    - MILD: No SOB at rest, mild SOB with walking, speaks normally in sentences, can lay down, no retractions, pulse < 100.    - MODERATE: SOB at rest, SOB with minimal exertion and prefers to sit, cannot lie down flat, speaks in phrases, mild retractions, audible wheezing, pulse 100-120.    - SEVERE: Very SOB at rest, speaks in single words, struggling to breathe, sitting hunched forward, retractions, pulse > 120      Moderate 5. RECURRENT SYMPTOM: "Have you had difficulty breathing before?" If so, ask: "When was the last time?" and "What happened that time?"      No 6. CARDIAC HISTORY: "Do you have any history of heart disease?" (e.g., heart attack, angina, bypass surgery, angioplasty)      Not asked referred to her PCP or go to the ED 7. LUNG HISTORY: "Do you have any history of lung disease?"  (e.g.,  pulmonary embolus, asthma, emphysema)     Not asked  Referred to her PCP or go to the ED 8. CAUSE: "What do you think is causing the breathing problem?"      I'm positive for COVID-19.   Is this part of that?   I thought it was. 9. OTHER SYMPTOMS: "Do you have any other symptoms? (e.g., dizziness, runny nose, cough, chest pain, fever)     Chest pain and getting winded easy with exertion. 10. PREGNANCY: "Is there any chance you are pregnant?" "When was your last menstrual period?"       N/A due to age 63. TRAVEL: "Have you traveled out of the country in the last month?" (e.g., travel history, exposures)       Not asked.  Protocols used: BREATHING DIFFICULTY-A-AH

## 2019-08-07 ENCOUNTER — Other Ambulatory Visit: Payer: Self-pay | Admitting: Family Medicine

## 2019-08-07 DIAGNOSIS — Z1231 Encounter for screening mammogram for malignant neoplasm of breast: Secondary | ICD-10-CM

## 2019-09-14 ENCOUNTER — Other Ambulatory Visit: Payer: Self-pay

## 2019-09-14 ENCOUNTER — Ambulatory Visit
Admission: RE | Admit: 2019-09-14 | Discharge: 2019-09-14 | Disposition: A | Discharge status: Home / Self Care | Payer: BLUE CROSS/BLUE SHIELD | Source: Ambulatory Visit | Attending: Family Medicine | Admitting: Family Medicine

## 2019-09-14 DIAGNOSIS — Z1231 Encounter for screening mammogram for malignant neoplasm of breast: Secondary | ICD-10-CM

## 2020-09-21 NOTE — Progress Notes (Signed)
Opened in error - please disregard

## 2021-06-01 IMAGING — MG DIGITAL SCREENING BILAT W/ TOMO W/ CAD
8 series · 8 of 24 positions shown · non-contrast
Comparison: Previous exam(s).

CLINICAL DATA: Screening.

EXAM:
DIGITAL SCREENING BILATERAL MAMMOGRAM WITH TOMO AND CAD

[R CC synth-2D]
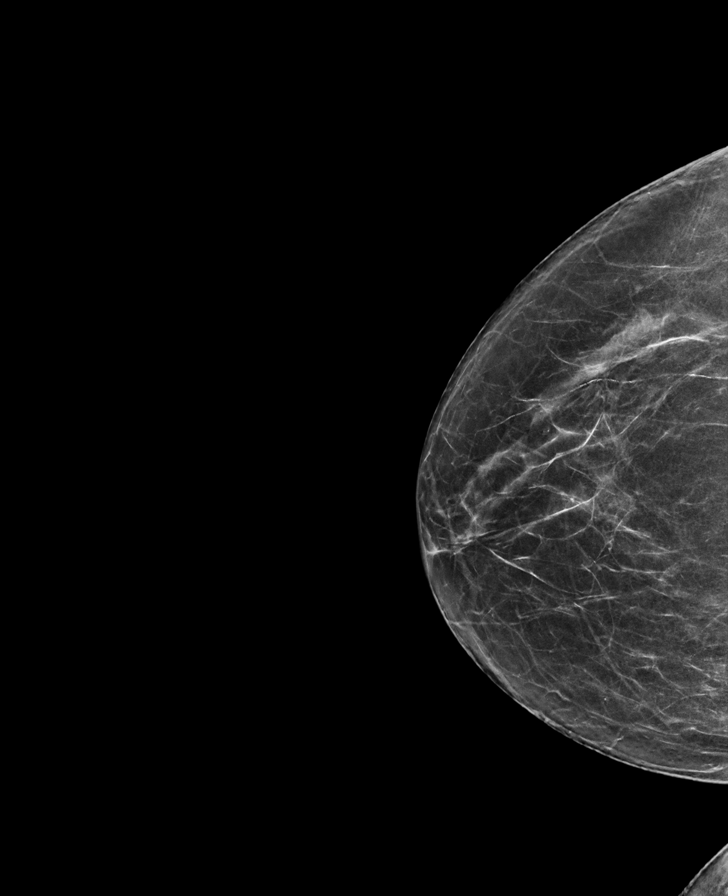

[L MLO synth-2D]
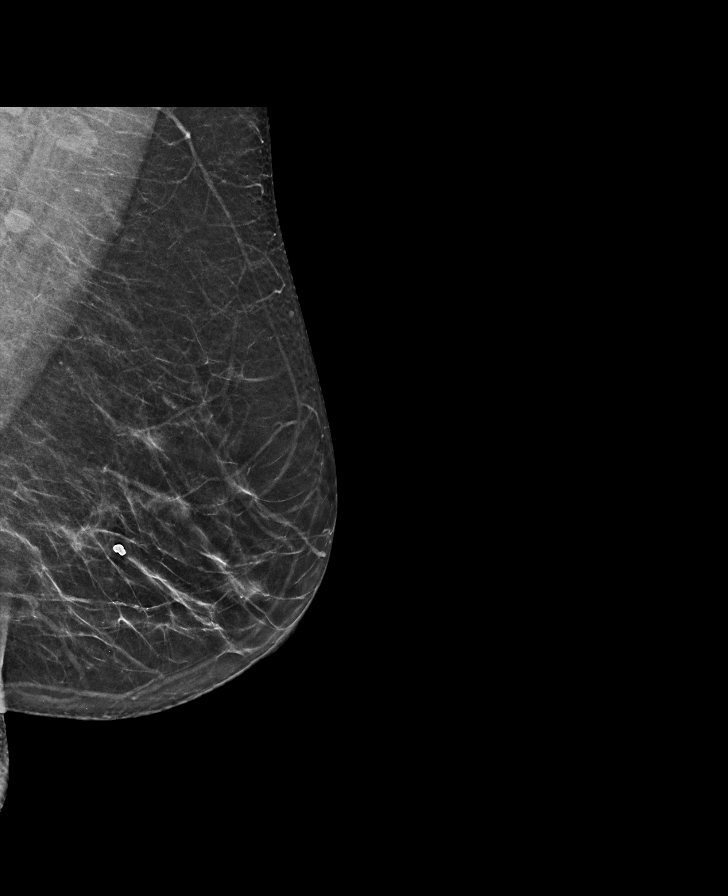

[R MLO synth-2D]
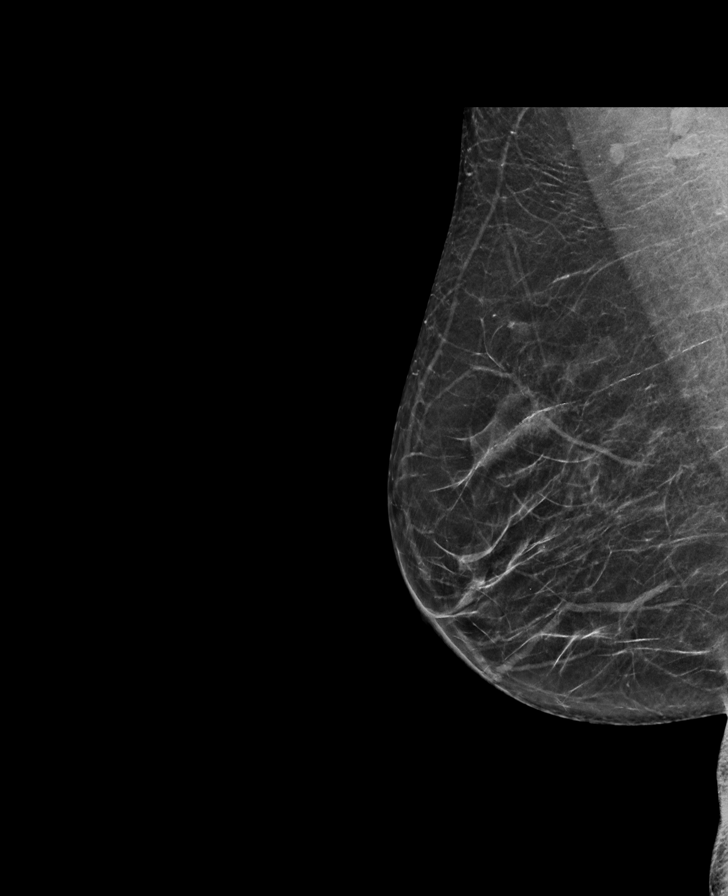

[L CC synth-2D]
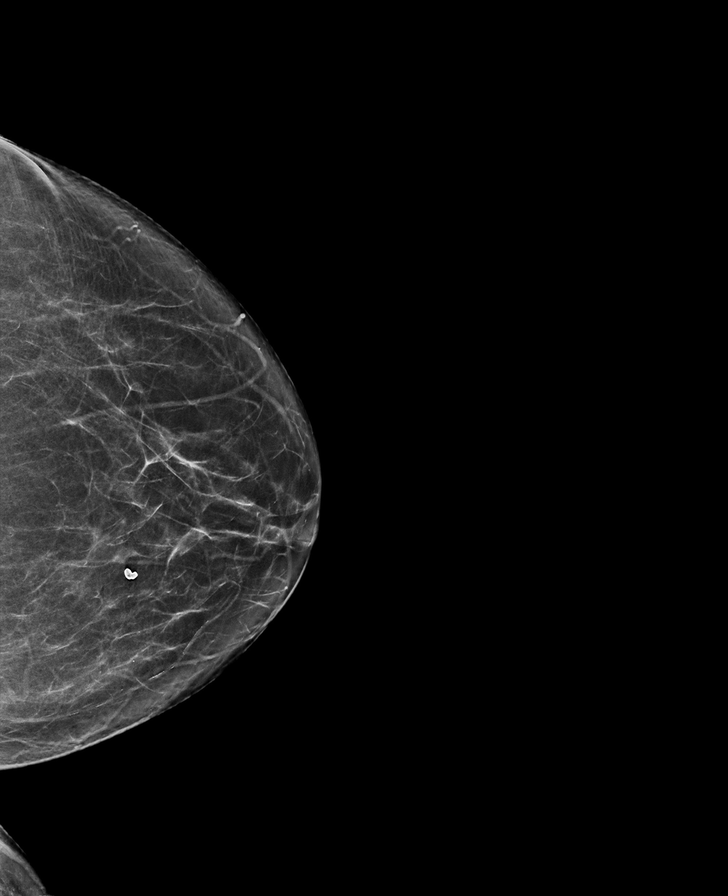

[R MLO tomo · tomo slice 31/62.0]
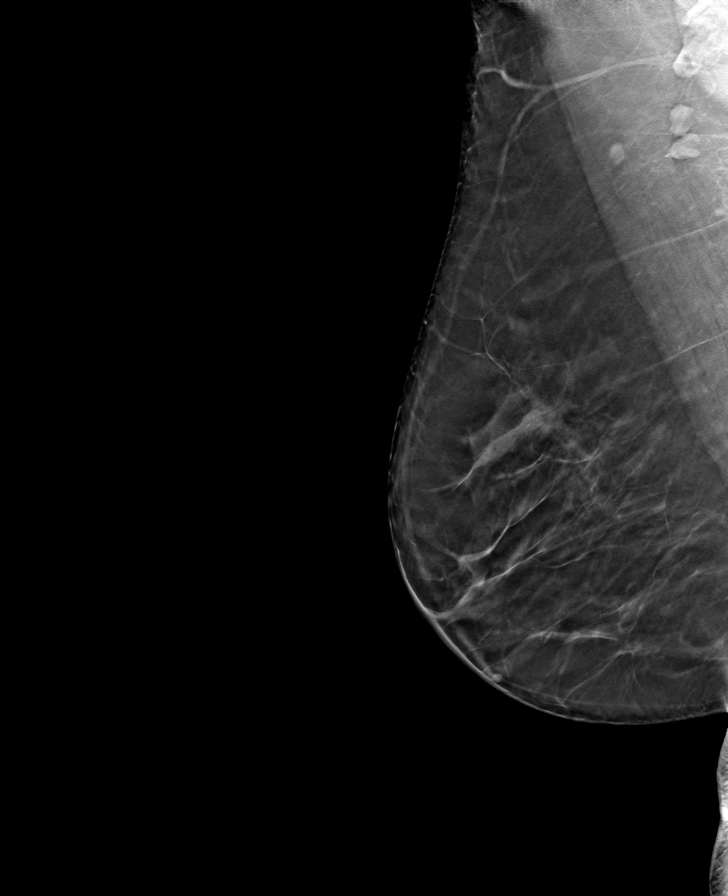

[R CC tomo · tomo slice 31/60.0]
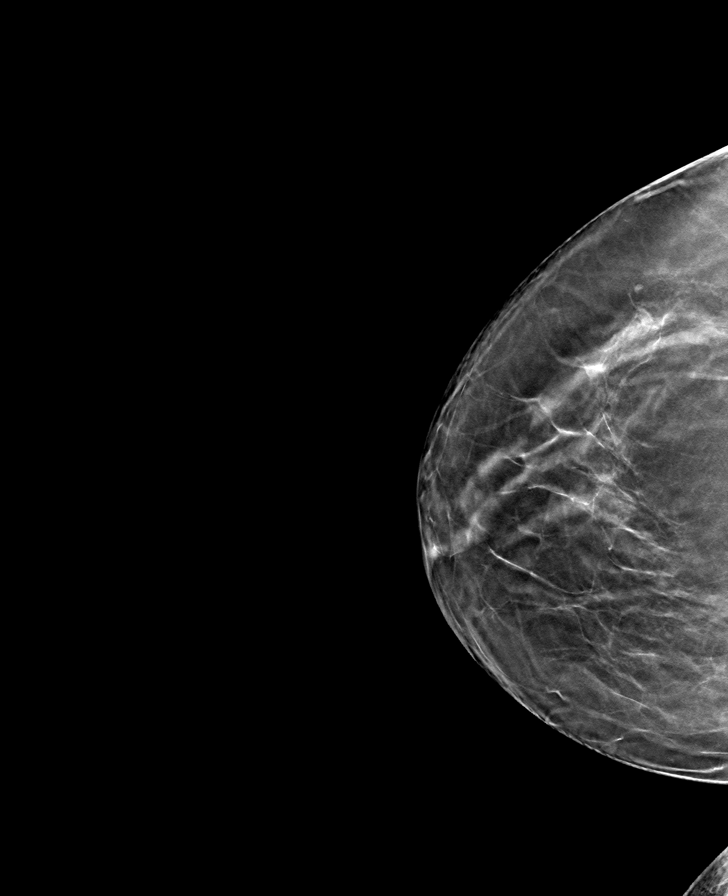

[L MLO tomo · tomo slice 31/62.0]
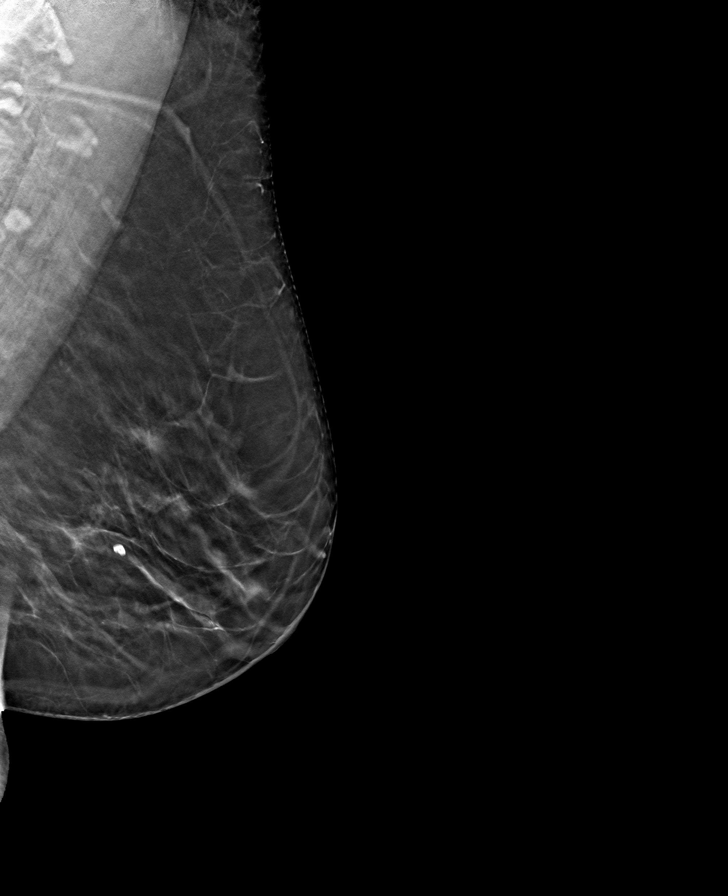

[L CC tomo · tomo slice 33/66.0]
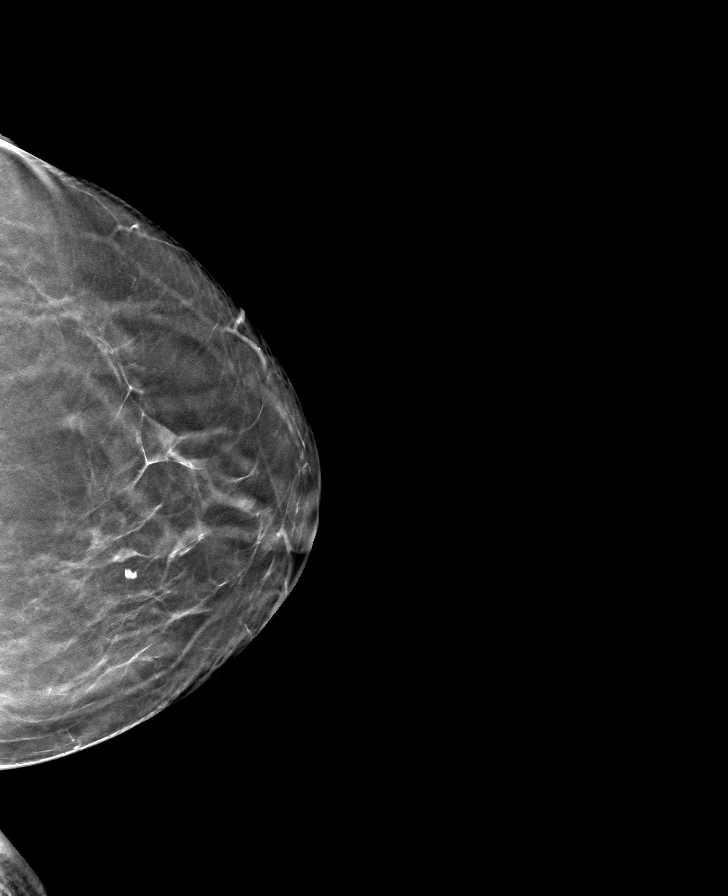

[8 of 24 positions shown; findings below may reference images not displayed]

ACR Breast Density Category b: There are scattered areas of
fibroglandular density.
FINDINGS: There are no findings suspicious for malignancy. Images were
processed with CAD.
IMPRESSION: No mammographic evidence of malignancy. A result letter of this
screening mammogram will be mailed directly to the patient.

RECOMMENDATION:
Screening mammogram in one year. (Code:[id number])

BI-RADS CATEGORY  1: Negative.

## 2021-06-27 ENCOUNTER — Other Ambulatory Visit: Payer: Self-pay | Admitting: Family Medicine

## 2021-06-27 DIAGNOSIS — M7989 Other specified soft tissue disorders: Secondary | ICD-10-CM

## 2021-06-28 ENCOUNTER — Ambulatory Visit
Admission: RE | Admit: 2021-06-28 | Discharge: 2021-06-28 | Disposition: A | Discharge status: Home / Self Care | Payer: BC Managed Care – PPO | Source: Ambulatory Visit | Attending: Family Medicine | Admitting: Family Medicine

## 2021-06-28 DIAGNOSIS — M7989 Other specified soft tissue disorders: Secondary | ICD-10-CM

## 2022-07-24 ENCOUNTER — Other Ambulatory Visit: Payer: Self-pay | Admitting: Family Medicine

## 2022-07-24 DIAGNOSIS — Z1231 Encounter for screening mammogram for malignant neoplasm of breast: Secondary | ICD-10-CM

## 2022-07-24 DIAGNOSIS — E2839 Other primary ovarian failure: Secondary | ICD-10-CM

## 2022-08-10 ENCOUNTER — Ambulatory Visit: Payer: BC Managed Care – PPO

## 2022-08-10 ENCOUNTER — Ambulatory Visit
Admission: RE | Admit: 2022-08-10 | Discharge: 2022-08-10 | Disposition: A | Discharge status: Home / Self Care | Payer: BC Managed Care – PPO | Source: Ambulatory Visit | Attending: Family Medicine | Admitting: Family Medicine

## 2022-08-10 DIAGNOSIS — Z1231 Encounter for screening mammogram for malignant neoplasm of breast: Secondary | ICD-10-CM

## 2023-03-16 IMAGING — US US EXTREM LOW VENOUS*R*
1 series · 13 of 24 positions shown · non-contrast
Comparison: None.

CLINICAL DATA: 65-year-old female with left lower extremity
swelling.

EXAM:
LEFT LOWER EXTREMITY VENOUS DOPPLER ULTRASOUND
TECHNIQUE: Gray-scale sonography with graded compression, as well as color
Doppler and duplex ultrasound were performed to evaluate the left
lower extremity deep venous systems from the level of the common
femoral vein and including the common femoral, femoral, profunda
femoral, popliteal and calf veins including the posterior tibial,
peroneal and gastrocnemius veins when visible. Spectral Doppler was
utilized to evaluate flow at rest and with distal augmentation
maneuvers in the common femoral, femoral and popliteal veins. The
contralateral common femoral vein was also evaluated for comparison.

[Series 1: us extrem low venous*right* · 0.07mm/px · 13 of 34 slices shown]
[im 1/34]
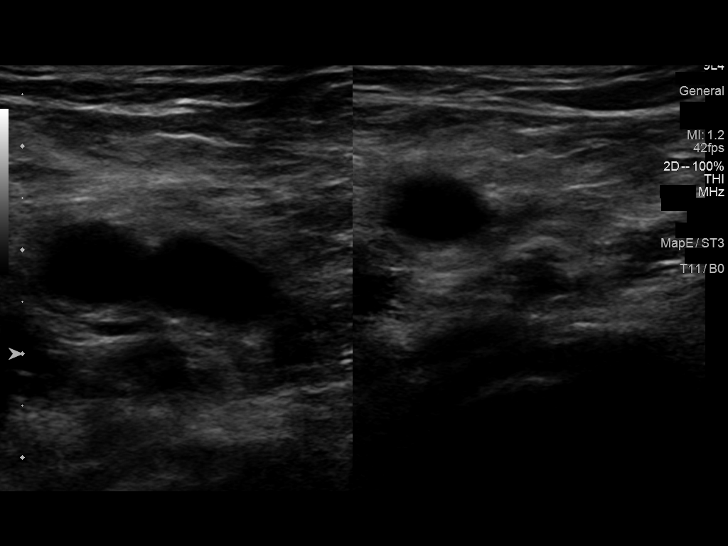
[im 3/34]
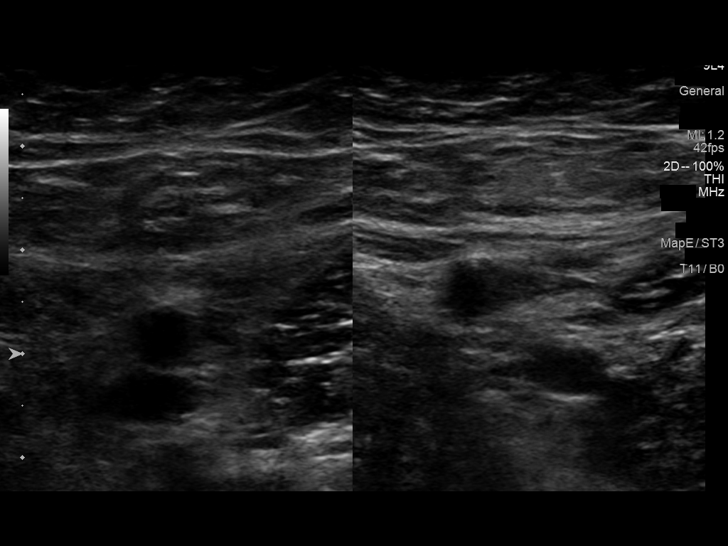
[im 6/34]
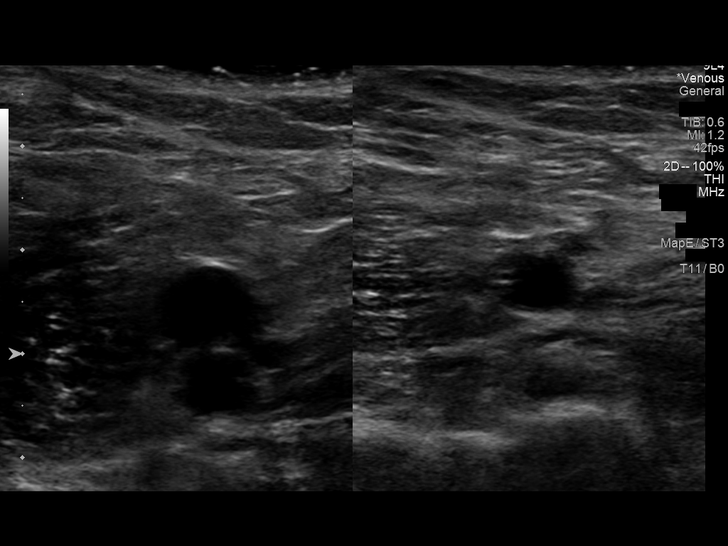
[im 9/34]
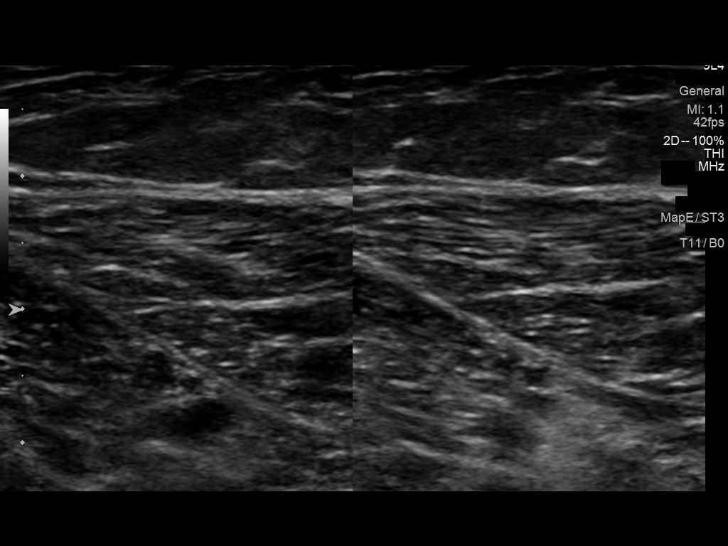
[im 12/34]
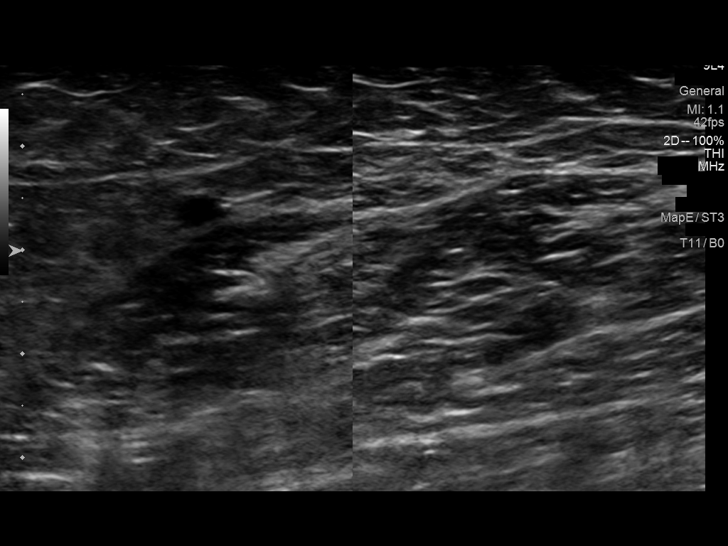
[im 15/34]
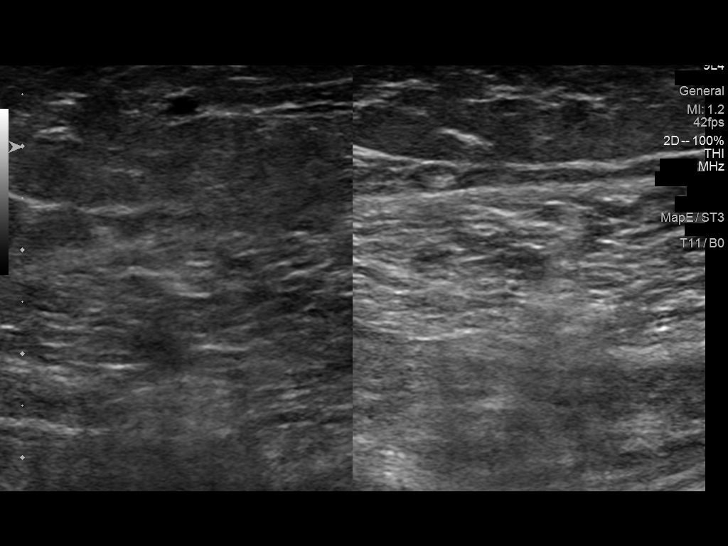
[im 18/34]
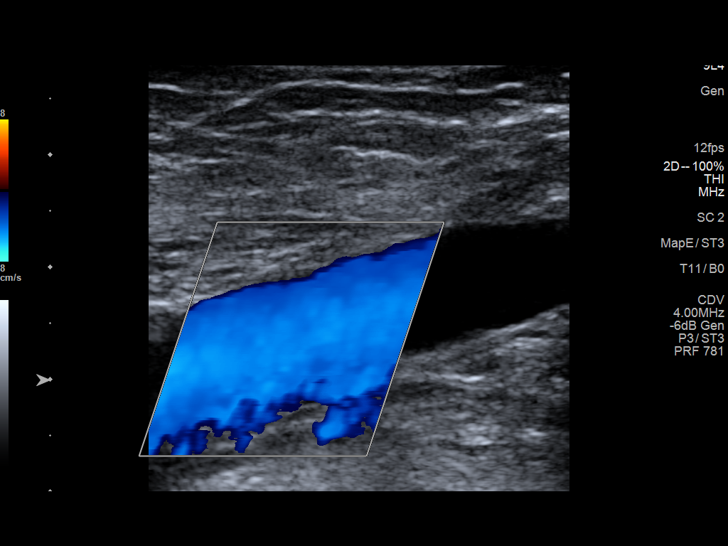
[im 19/34]
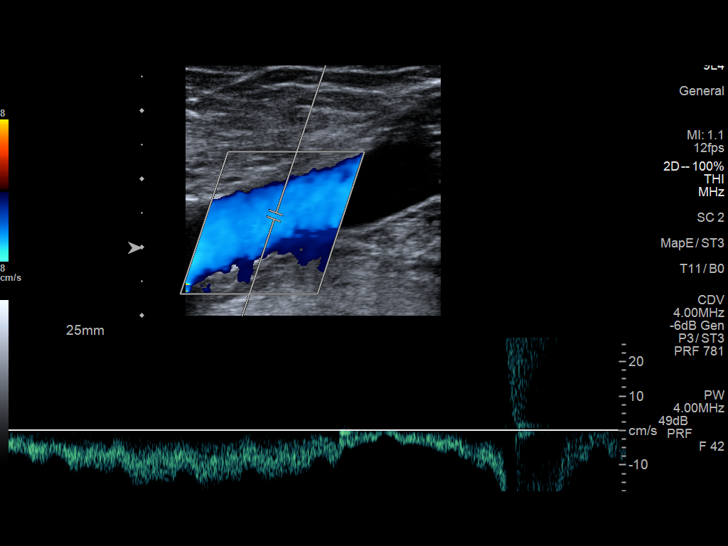
[im 22/34]
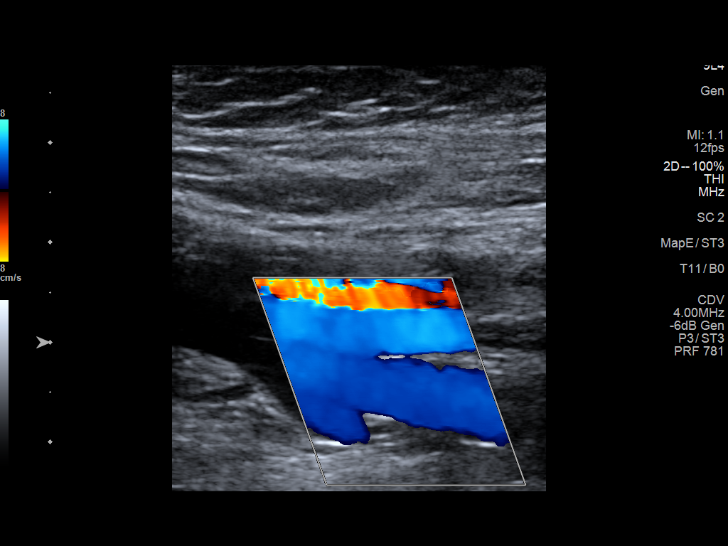
[im 25/34]
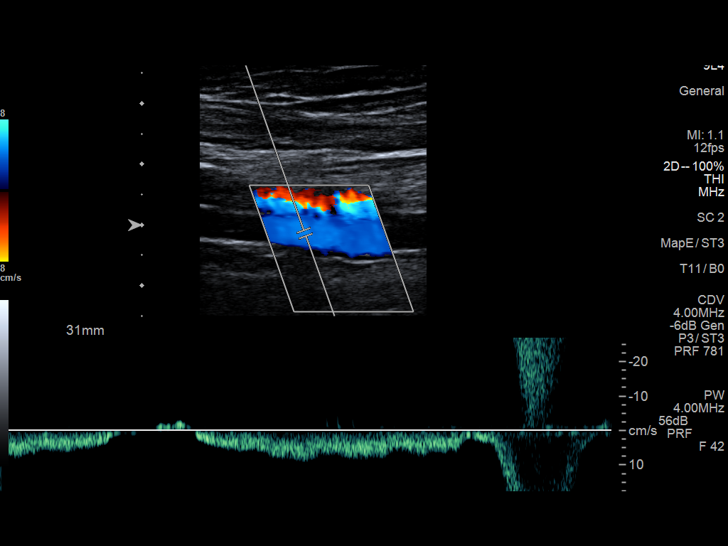
[im 28/34]
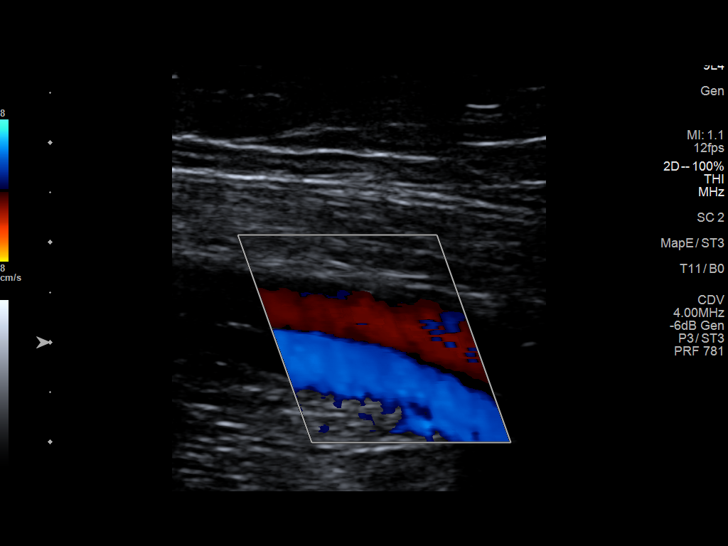
[im 31/34]
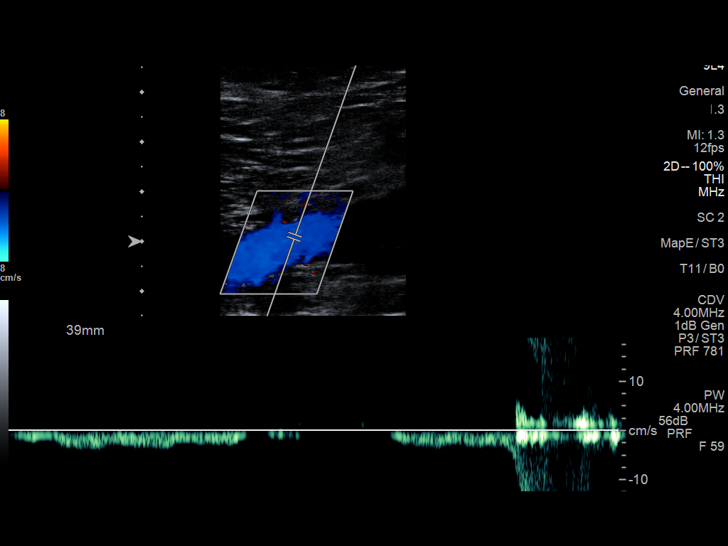
[im 34/34]
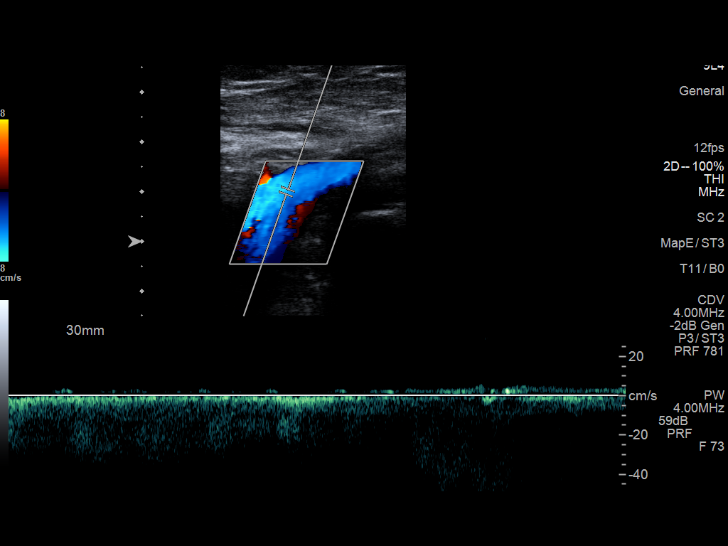

[13 of 24 positions shown; findings below may reference images not displayed]

FINDINGS: LEFT LOWER EXTREMITY

Common Femoral Vein: No evidence of thrombus. Normal
compressibility, respiratory phasicity and response to augmentation.

Central Greater Saphenous Vein: No evidence of thrombus. Normal
compressibility and flow on color Doppler imaging.

Central Profunda Femoral Vein: No evidence of thrombus. Normal
compressibility and flow on color Doppler imaging.

Femoral Vein: No evidence of thrombus. Normal compressibility,
respiratory phasicity and response to augmentation.

Popliteal Vein: No evidence of thrombus. Normal compressibility,
respiratory phasicity and response to augmentation.

Calf Veins: No evidence of thrombus. Normal compressibility and flow
on color Doppler imaging.

Other Findings:  None.

RIGHT LOWER EXTREMITY

Common Femoral Vein: No evidence of thrombus. Normal
compressibility, respiratory phasicity and response to augmentation.
IMPRESSION: No evidence of left lower extremity deep venous thrombosis.
